# Patient Record
Sex: Female | Born: 1970 | Race: White | Hispanic: No | Marital: Single | Smoking: Current every day smoker
Health system: Southern US, Community
[De-identification: ages and names within clinical notes are randomized; demographics above are authoritative.]

## PROBLEM LIST (undated history)

## (undated) DIAGNOSIS — F32A Depression, unspecified: Secondary | ICD-10-CM

## (undated) DIAGNOSIS — F329 Major depressive disorder, single episode, unspecified: Secondary | ICD-10-CM

---

## 2012-09-09 ENCOUNTER — Other Ambulatory Visit: Payer: Self-pay | Admitting: Obstetrics & Gynecology

## 2012-09-09 DIAGNOSIS — R928 Other abnormal and inconclusive findings on diagnostic imaging of breast: Secondary | ICD-10-CM

## 2012-09-21 ENCOUNTER — Other Ambulatory Visit: Payer: Self-pay

## 2012-09-24 ENCOUNTER — Other Ambulatory Visit: Payer: Self-pay

## 2012-09-29 ENCOUNTER — Ambulatory Visit
Admission: RE | Admit: 2012-09-29 | Discharge: 2012-09-29 | Disposition: A | Discharge status: Home / Self Care | Payer: No Typology Code available for payment source | Source: Ambulatory Visit | Attending: Obstetrics & Gynecology | Admitting: Obstetrics & Gynecology

## 2012-09-29 ENCOUNTER — Other Ambulatory Visit: Payer: Self-pay | Admitting: Obstetrics & Gynecology

## 2012-09-29 DIAGNOSIS — R928 Other abnormal and inconclusive findings on diagnostic imaging of breast: Secondary | ICD-10-CM

## 2012-09-29 DIAGNOSIS — R921 Mammographic calcification found on diagnostic imaging of breast: Secondary | ICD-10-CM

## 2012-10-07 ENCOUNTER — Ambulatory Visit
Admission: RE | Admit: 2012-10-07 | Discharge: 2012-10-07 | Disposition: A | Discharge status: Home / Self Care | Payer: No Typology Code available for payment source | Source: Ambulatory Visit | Attending: Obstetrics & Gynecology | Admitting: Obstetrics & Gynecology

## 2012-10-07 ENCOUNTER — Other Ambulatory Visit: Payer: Self-pay | Admitting: Obstetrics & Gynecology

## 2012-10-07 DIAGNOSIS — R928 Other abnormal and inconclusive findings on diagnostic imaging of breast: Secondary | ICD-10-CM

## 2012-10-07 DIAGNOSIS — R921 Mammographic calcification found on diagnostic imaging of breast: Secondary | ICD-10-CM

## 2012-10-08 ENCOUNTER — Ambulatory Visit
Admission: RE | Admit: 2012-10-08 | Discharge: 2012-10-08 | Disposition: A | Discharge status: Home / Self Care | Payer: No Typology Code available for payment source | Source: Ambulatory Visit | Attending: Obstetrics & Gynecology | Admitting: Obstetrics & Gynecology

## 2012-10-08 ENCOUNTER — Other Ambulatory Visit: Payer: Self-pay | Admitting: Obstetrics & Gynecology

## 2012-10-08 DIAGNOSIS — R921 Mammographic calcification found on diagnostic imaging of breast: Secondary | ICD-10-CM

## 2013-05-11 ENCOUNTER — Other Ambulatory Visit: Payer: Self-pay | Admitting: Obstetrics & Gynecology

## 2013-05-11 DIAGNOSIS — R921 Mammographic calcification found on diagnostic imaging of breast: Secondary | ICD-10-CM

## 2013-05-27 ENCOUNTER — Ambulatory Visit
Admission: RE | Admit: 2013-05-27 | Discharge: 2013-05-27 | Disposition: A | Discharge status: Home / Self Care | Payer: No Typology Code available for payment source | Source: Ambulatory Visit | Attending: Obstetrics & Gynecology | Admitting: Obstetrics & Gynecology

## 2013-05-27 DIAGNOSIS — R921 Mammographic calcification found on diagnostic imaging of breast: Secondary | ICD-10-CM

## 2013-12-24 ENCOUNTER — Emergency Department (HOSPITAL_COMMUNITY)
Admission: EM | Admit: 2013-12-24 | Discharge: 2013-12-24 | Disposition: A | Discharge status: Home / Self Care | Payer: No Typology Code available for payment source | Attending: Emergency Medicine | Admitting: Emergency Medicine

## 2013-12-24 ENCOUNTER — Encounter (HOSPITAL_COMMUNITY): Payer: Self-pay | Admitting: Emergency Medicine

## 2013-12-24 DIAGNOSIS — Z792 Long term (current) use of antibiotics: Secondary | ICD-10-CM | POA: Insufficient documentation

## 2013-12-24 DIAGNOSIS — M7989 Other specified soft tissue disorders: Secondary | ICD-10-CM

## 2013-12-24 DIAGNOSIS — R0602 Shortness of breath: Secondary | ICD-10-CM

## 2013-12-24 DIAGNOSIS — H6690 Otitis media, unspecified, unspecified ear: Secondary | ICD-10-CM | POA: Insufficient documentation

## 2013-12-24 DIAGNOSIS — R0609 Other forms of dyspnea: Secondary | ICD-10-CM | POA: Insufficient documentation

## 2013-12-24 DIAGNOSIS — F17200 Nicotine dependence, unspecified, uncomplicated: Secondary | ICD-10-CM | POA: Insufficient documentation

## 2013-12-24 DIAGNOSIS — Z79899 Other long term (current) drug therapy: Secondary | ICD-10-CM | POA: Insufficient documentation

## 2013-12-24 DIAGNOSIS — H6691 Otitis media, unspecified, right ear: Secondary | ICD-10-CM

## 2013-12-24 DIAGNOSIS — F329 Major depressive disorder, single episode, unspecified: Secondary | ICD-10-CM | POA: Insufficient documentation

## 2013-12-24 DIAGNOSIS — R0989 Other specified symptoms and signs involving the circulatory and respiratory systems: Secondary | ICD-10-CM | POA: Insufficient documentation

## 2013-12-24 DIAGNOSIS — R609 Edema, unspecified: Secondary | ICD-10-CM | POA: Insufficient documentation

## 2013-12-24 DIAGNOSIS — F3289 Other specified depressive episodes: Secondary | ICD-10-CM | POA: Insufficient documentation

## 2013-12-24 HISTORY — DX: Depression, unspecified: F32.A

## 2013-12-24 HISTORY — DX: Major depressive disorder, single episode, unspecified: F32.9

## 2013-12-24 MED ORDER — AMOXICILLIN 500 MG PO CAPS: 500.0000 mg | ORAL_CAPSULE | Freq: Three times a day (TID) | ORAL | Status: AC

## 2013-12-24 NOTE — ED Notes (Signed)
Pt reports recently traveling in a car long distance, having swelling to bilateral lower legs. Pt went to an ucc today for ear pain and diagnosed with ear infection but sent here to r/o dvt. Airway intact, no acute distress noted at triage.

## 2013-12-24 NOTE — ED Notes (Signed)
Asked to come to [facility] Ed b/c of SOB and bilateral pitting edema.  Been traveling much in the last week or so.

## 2013-12-24 NOTE — Progress Notes (Signed)
VASCULAR LAB PRELIMINARY  PRELIMINARY  PRELIMINARY  PRELIMINARY  Bilateral lower extremity venous Dopplers completed.    Preliminary report:  There is no DVT or SVT noted in the bilateral lower extremities.  Masami Plata, RVT 12/24/2013, 5:45 PM

## 2013-12-25 NOTE — ED Provider Notes (Signed)
CSN: [id number]     Arrival date & time 12/24/13  1554 History   First MD Initiated Contact with Patient 12/24/13 2249     Chief Complaint  Patient presents with  . Leg Swelling    HPI Pt presents for multiple complaints:  She has noticed bilateral LE swelling for past several days, she reports it is worsening and nothing improves her symptoms.  She also reports recent travel and is concerned for DVT  She also reports recent cough and flu like symptoms and now has bilateral ear pain. She reports her cough is improving She was seen at an urgent care for her symptoms and sent for evaluation  She also reports difficulty taking a deep breath - denies chest pain, but does report some SOB at times, but none currently   Past Medical History  Diagnosis Date  . Depression    History reviewed. No pertinent past surgical history. History reviewed. No pertinent family history. History  Substance Use Topics  . Smoking status: Current Every Day Smoker -- 0.50 packs/day    Types: Cigarettes  . Smokeless tobacco: Not on file  . Alcohol Use: Yes     Comment: moderate   OB History   Grav Para Term Preterm Abortions TAB SAB Ect Mult Living                 Review of Systems  Respiratory: Positive for cough and shortness of breath.   Cardiovascular: Positive for leg swelling. Negative for chest pain.  All other systems reviewed and are negative.    Allergies  Other and Tamiflu  Home Medications   Current Outpatient Rx  Name  Route  Sig  Dispense  Refill  . acetaminophen (TYLENOL) 500 MG tablet   Oral   Take 1,000 mg by mouth 3 (three) times daily as needed for moderate pain.         Marland Kitchen ALPRAZolam (XANAX) 0.25 MG tablet   Oral   Take 0.25 mg by mouth daily as needed for anxiety.          . carboxymethylcellulose (REFRESH PLUS) 0.5 % SOLN   Both Eyes   Place 1 drop into both eyes 4 (four) times daily as needed (for dry eyes).          . DULoxetine (CYMBALTA) 60 MG  capsule   Oral   Take 60 mg by mouth daily.         Marland Kitchen ibuprofen (ADVIL,MOTRIN) 200 MG tablet   Oral   Take 400 mg by mouth every 6 (six) hours as needed for moderate pain.         Marland Kitchen lamoTRIgine (LAMICTAL) 25 MG tablet   Oral   Take 50 mg by mouth every morning.         . Multiple Vitamins-Minerals (CENTRUM) tablet   Oral   Take 1 tablet by mouth daily.         . norethindrone (MICRONOR,CAMILA,ERRIN) 0.35 MG tablet   Oral   Take 1 tablet by mouth daily.         . Pseudoeph-Doxylamine-DM-APAP (NYQUIL MULTI-SYMPTOM PO)   Oral   Take 10 mLs by mouth every 6 (six) hours as needed (for cold).         Marland Kitchen amoxicillin (AMOXIL) 500 MG capsule   Oral   Take 1 capsule (500 mg total) by mouth 3 (three) times daily.   21 capsule   0    BP 115/80  Pulse 93  Temp(Src) 98.6  F (37 C) (Oral)  Resp 18  SpO2 100% Physical Exam CONSTITUTIONAL: Well developed/well nourished HEAD: Normocephalic/atraumatic EYES: EOMI/PERRL ENMT: Mucous membranes moist, right TM erythematous, bulging, TM is intact NECK: supple no meningeal signs CV: S1/S2 noted, no murmurs/rubs/gallops noted LUNGS: Lungs are clear to auscultation bilaterally, no apparent distress ABDOMEN: soft, nontender, no rebound or guarding NEURO: Pt is awake/alert, moves all extremitiesx4 EXTREMITIES: pulses normal, full ROM, minimal pitting edema to bilateral LE, no erythema, minimal tenderness noted SKIN: warm, color normal PSYCH: anxious  ED Course  Procedures (including critical care time) Labs Review Labs Reviewed - No data to display Imaging Review No results found.  EKG Interpretation   None       MDM   1. Otitis media, right   2. Peripheral edema    Nursing notes including past medical history and social history reviewed and considered in documentation   Pt with negative DVT studies here She is in no distress, no hypoxia I doubt PE.  I did offer further testing including CXR/EKG but she feels  improved and would like to go home She reports she will f/u with PCP in two days Will place on amox for otitis media      Joya Gaskins, MD 12/25/13 [phone]

## 2014-10-04 ENCOUNTER — Other Ambulatory Visit: Payer: Self-pay

## 2014-10-05 LAB — CYTOLOGY - PAP

## 2014-10-11 ENCOUNTER — Other Ambulatory Visit: Payer: Self-pay | Admitting: Obstetrics & Gynecology

## 2014-10-11 DIAGNOSIS — R921 Mammographic calcification found on diagnostic imaging of breast: Secondary | ICD-10-CM

## 2014-10-20 ENCOUNTER — Ambulatory Visit
Admission: RE | Admit: 2014-10-20 | Discharge: 2014-10-20 | Disposition: A | Discharge status: Home / Self Care | Payer: 59 | Source: Ambulatory Visit | Attending: Obstetrics & Gynecology | Admitting: Obstetrics & Gynecology

## 2014-10-20 ENCOUNTER — Encounter (INDEPENDENT_AMBULATORY_CARE_PROVIDER_SITE_OTHER): Payer: Self-pay

## 2014-10-20 DIAGNOSIS — R921 Mammographic calcification found on diagnostic imaging of breast: Secondary | ICD-10-CM

## 2015-01-09 IMAGING — MG MM DIAGNOSTIC BILATERAL
8 of 9 series · 8 of 9 positions shown · non-contrast
Comparison: Previous exams.

CLINICAL DATA: Patient presents for a bilateral diagnostic exam to
followup left breast microcalcifications.

EXAM:
DIGITAL DIAGNOSTIC  bilateral MAMMOGRAM WITH CAD

[R CC]
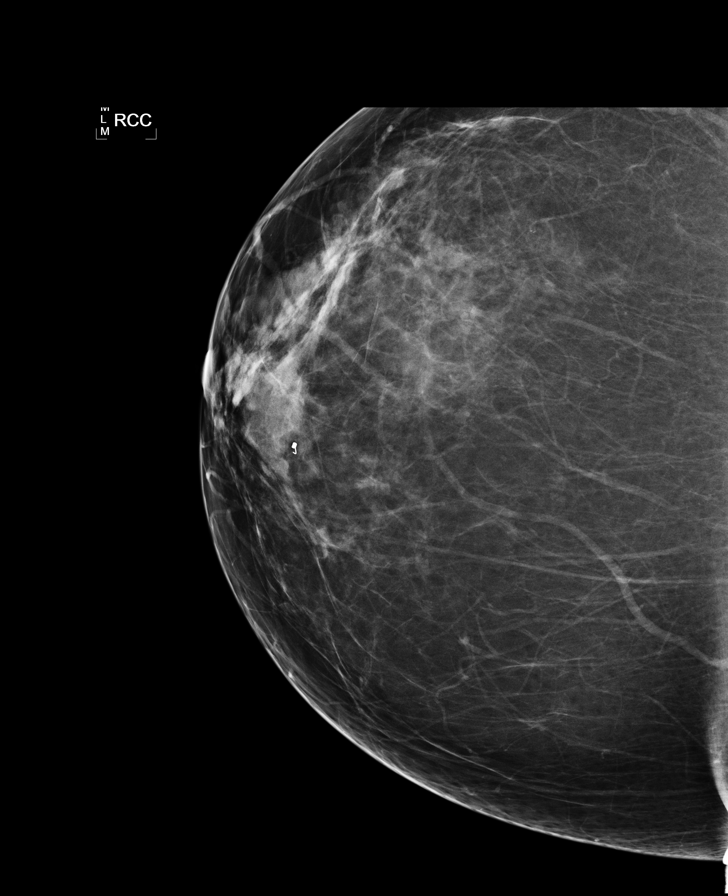

[L CC (1 of 2)]
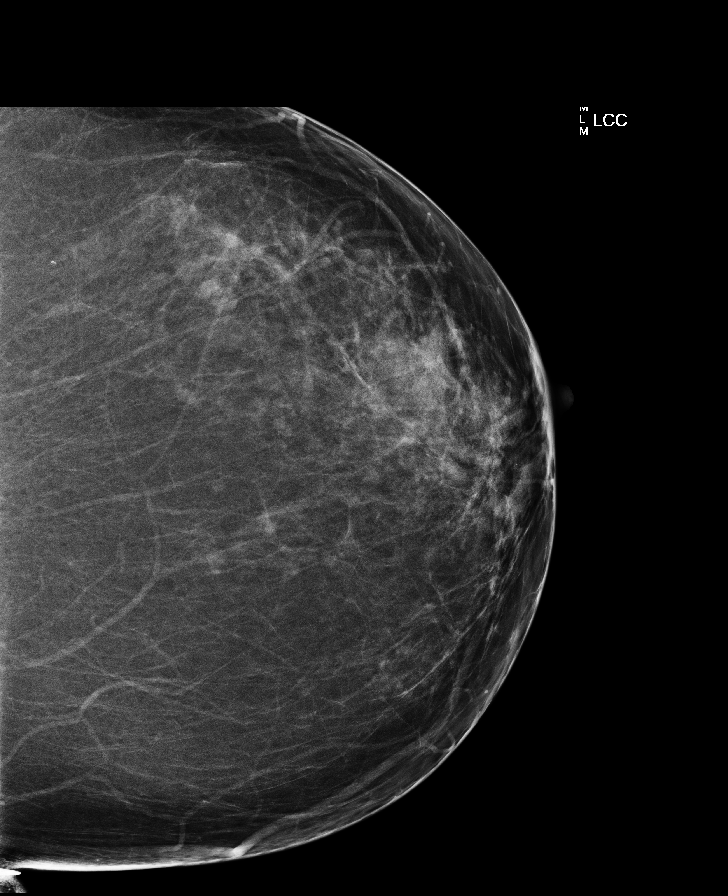

[L MLO]
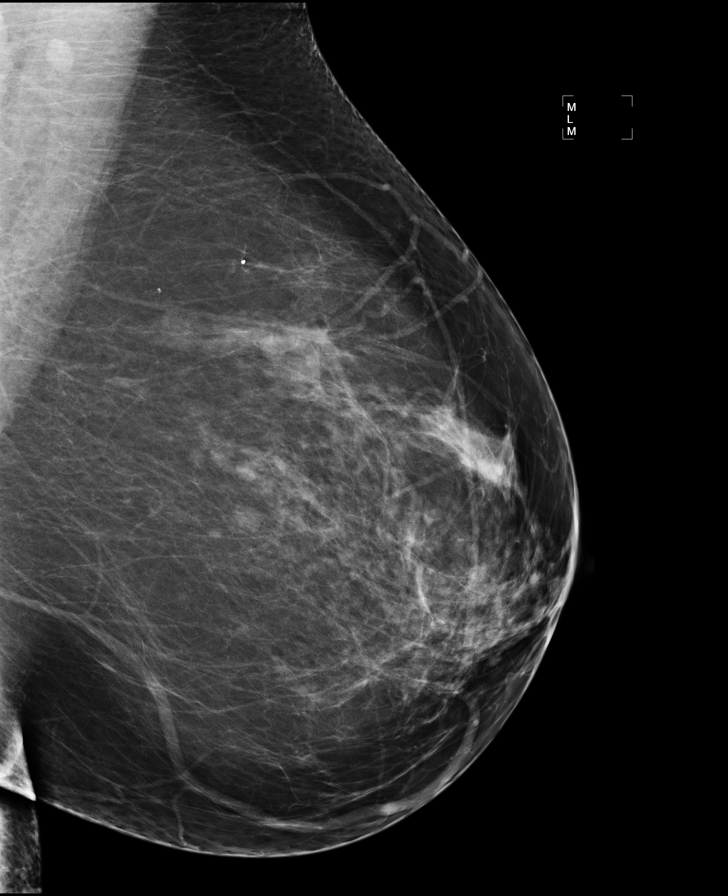

[R MLO]
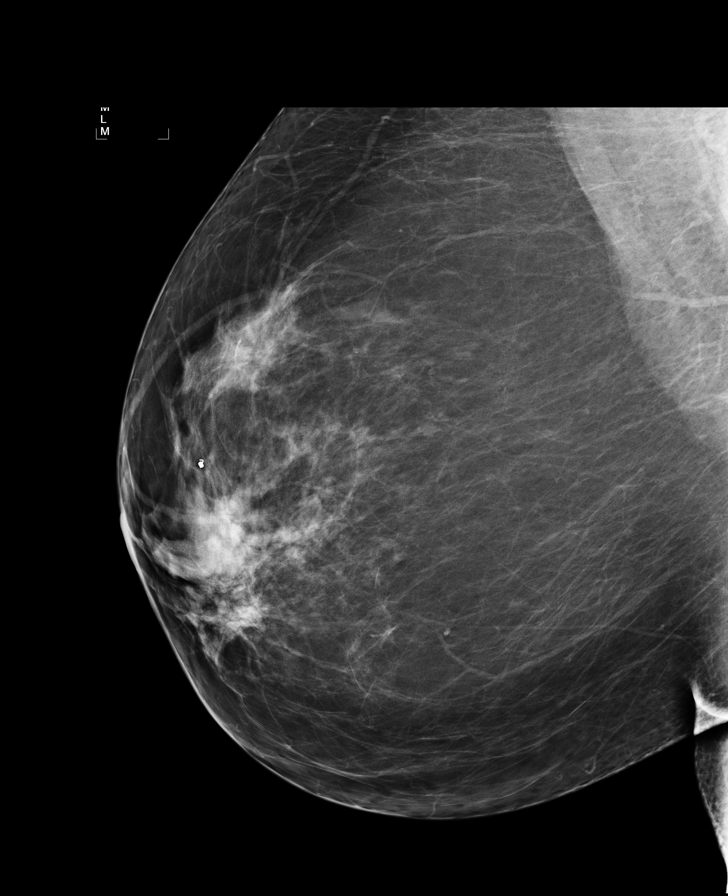

[L CC (2 of 2)]
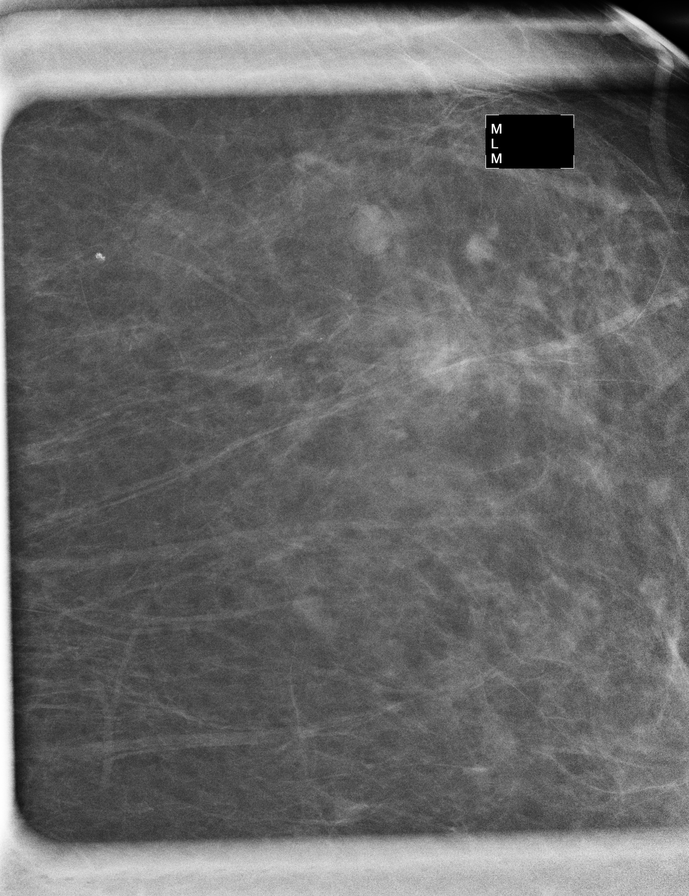

[L ML (1 of 3)]
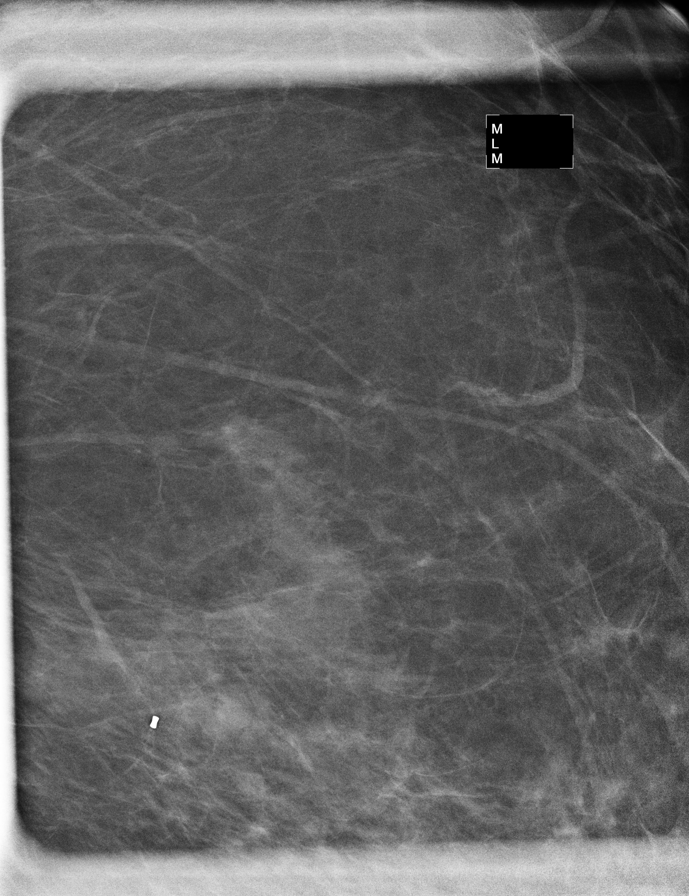

[L ML (2 of 3)]
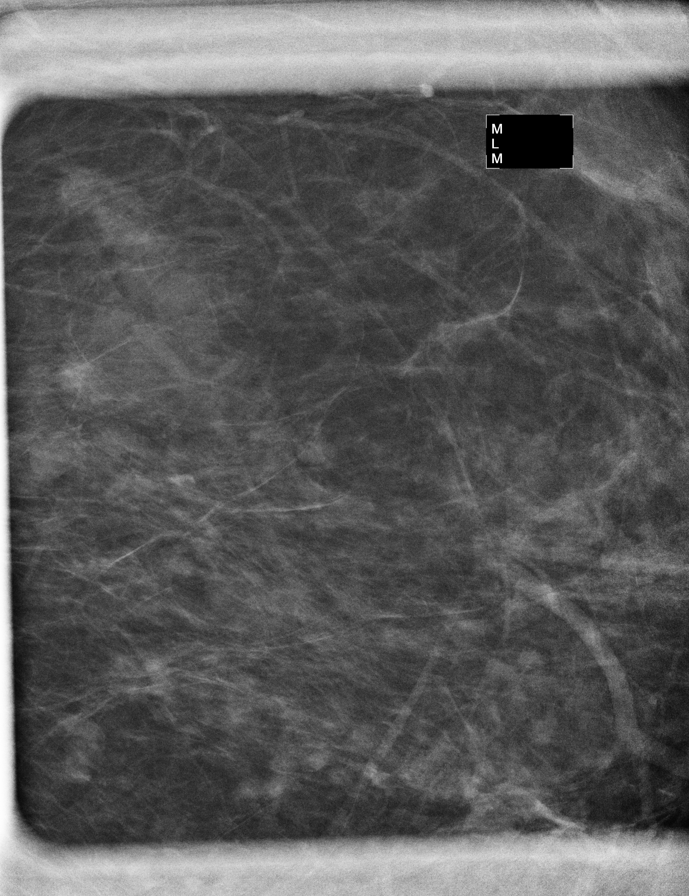

[L ML (3 of 3)]
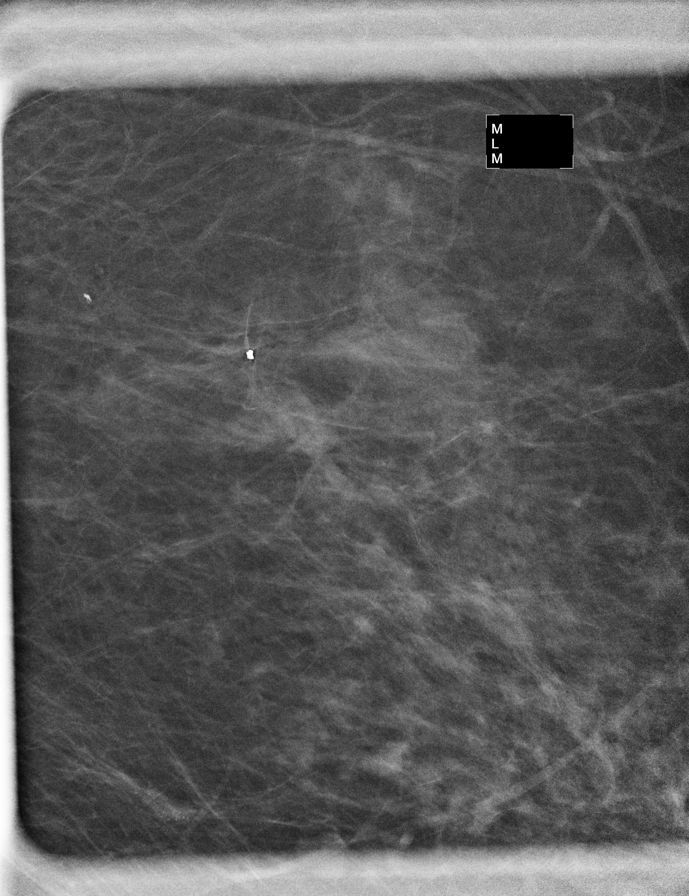

[8 of 9 positions shown; findings below may reference images not displayed]

ACR Breast Density Category b: There are scattered areas of
fibroglandular density.
FINDINGS: Examination again demonstrates a group of fine/punctate
microcalcifications over the deep third of the outer lower left
breast spanning 5 x 8 x 11 mm. These are unchanged on the Mag CC
image with possible slight increase in number on the Mag MLO image,
although this is may be due in part to technique with minimal motion
on the previous images. Remainder of the exam is unchanged.

Mammographic images were processed with CAD.
IMPRESSION: Typically benign group of microcalcifications over the deep third of
the outer lower left breast spanning 5 x 8 x 11 mm without
significant change.

RECOMMENDATION:
As a precaution, would recommend 1 additional followup diagnostic
left breast mammogram with magnification views in 1 year at the time
of patient's annual mammogram to confirm stability.

I have discussed the findings and recommendations with the patient.
Results were also provided in writing at the conclusion of the
visit. If applicable, a reminder letter will be sent to the patient
regarding the next appointment.

BI-RADS CATEGORY  3: Probably benign.

## 2016-12-03 ENCOUNTER — Other Ambulatory Visit: Payer: Self-pay | Admitting: Obstetrics & Gynecology

## 2016-12-03 DIAGNOSIS — R921 Mammographic calcification found on diagnostic imaging of breast: Secondary | ICD-10-CM

## 2016-12-09 ENCOUNTER — Ambulatory Visit
Admission: RE | Admit: 2016-12-09 | Discharge: 2016-12-09 | Disposition: A | Discharge status: Home / Self Care | Payer: BLUE CROSS/BLUE SHIELD | Source: Ambulatory Visit | Attending: Obstetrics & Gynecology | Admitting: Obstetrics & Gynecology

## 2016-12-09 ENCOUNTER — Other Ambulatory Visit: Payer: Self-pay | Admitting: Obstetrics & Gynecology

## 2016-12-09 DIAGNOSIS — R921 Mammographic calcification found on diagnostic imaging of breast: Secondary | ICD-10-CM

## 2016-12-09 DIAGNOSIS — N6320 Unspecified lump in the left breast, unspecified quadrant: Secondary | ICD-10-CM

## 2017-02-28 IMAGING — US ULTRASOUND LEFT BREAST LIMITED
1 series · 7 of 7 positions shown · non-contrast
Comparison: Previous exam(s).

CLINICAL DATA: Followup for probably benign left breast
calcifications.

EXAM:
2D DIGITAL DIAGNOSTIC BILATERAL MAMMOGRAM WITH CAD AND ADJUNCT TOMO
LEFT BREAST ULTRASOUND

[Series 1: ultrasound left breast limited · 0.06mm/px · 7 of 7 slices shown]
[im 1/7]
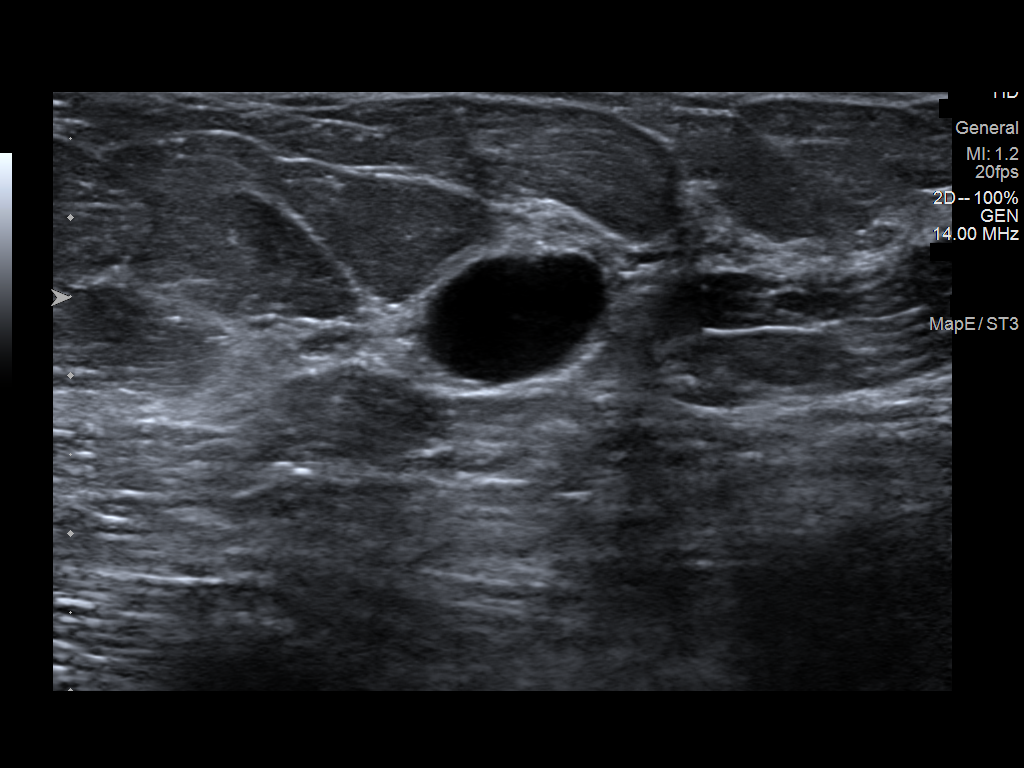
[im 2/7]
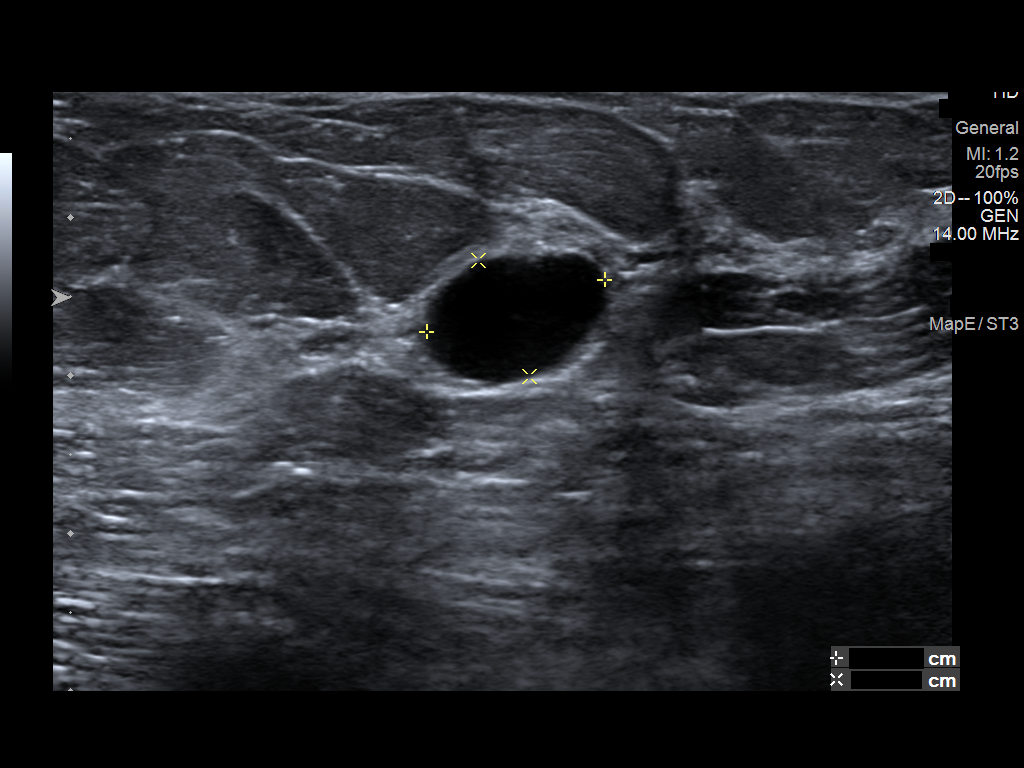
[im 3/7]
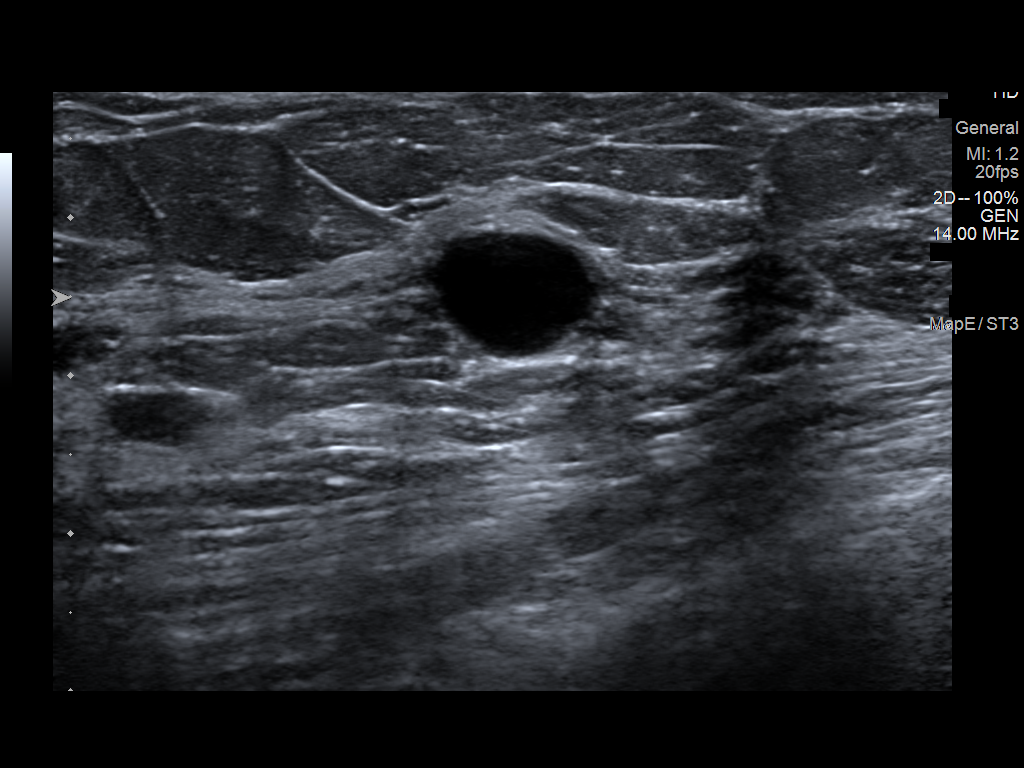
[im 4/7]
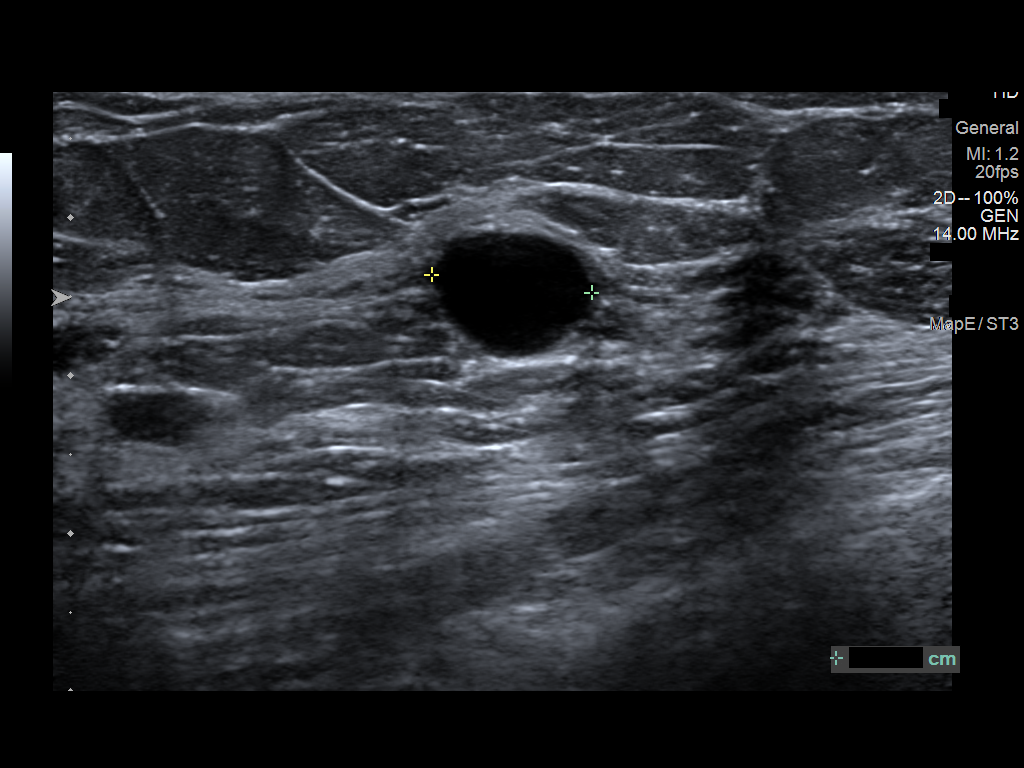
[im 5/7]
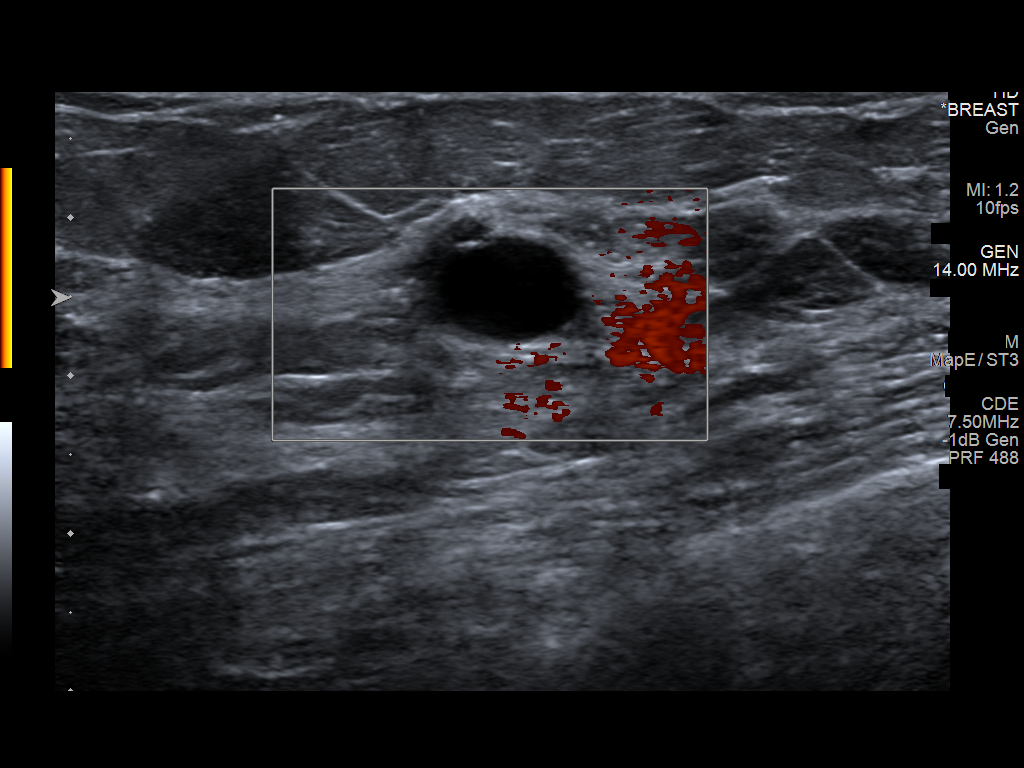
[im 6/7]
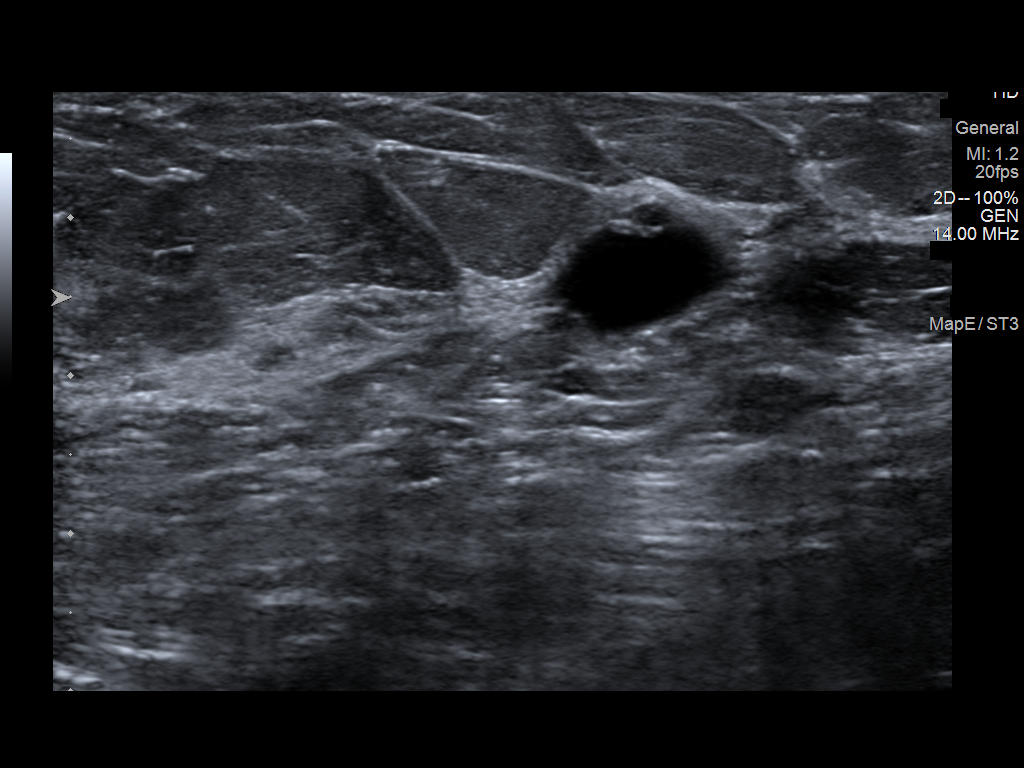
[im 7/7]
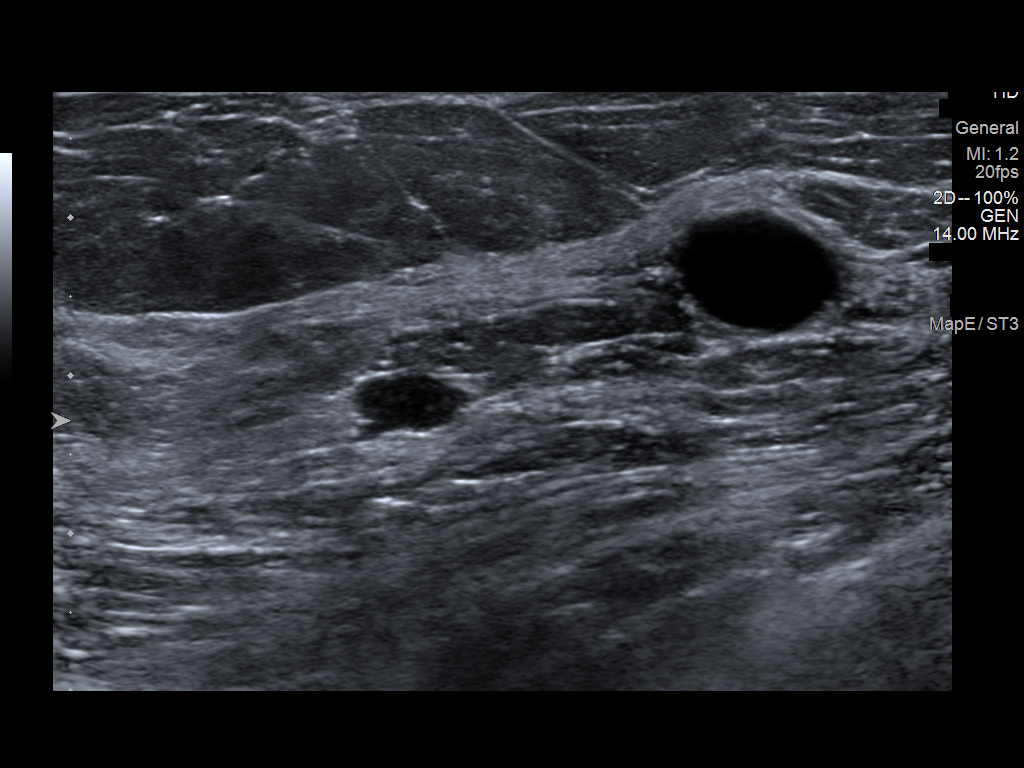

[7 of 7 positions shown; findings below may reference images not displayed]

ACR Breast Density Category b: There are scattered areas of
fibroglandular density.
FINDINGS: No suspicious masses or calcifications are seen in the right breast.
There is an oval mass in the outer left breast measuring
approximately 1.3 cm. Spot compression magnification views were
performed over the slightly outer left breast demonstrating a stable
7 mm group of punctate calcifications.

Mammographic images were processed with CAD.

Targeted ultrasound of the outer left breast was performed. There is
a cyst at 2 o'clock 6 cm from nipple measuring 1.2 x 0.8 x 1 cm.
This corresponds well with the mass seen at mammography. In addition
smaller cysts are seen in the outer left breast.
IMPRESSION: 1. Benign left breast calcifications which have demonstrated greater
than 2 years of stability.

2. Benign left breast cyst.

3.  No mammographic evidence of malignancy in either breast.

RECOMMENDATION:
Screening mammogram in one year.(Code:[id number]-Q-WA1)

I have discussed the findings and recommendations with the patient.
Results were also provided in writing at the conclusion of the
visit. If applicable, a reminder letter will be sent to the patient
regarding the next appointment.

BI-RADS CATEGORY  2: Benign.

## 2023-01-29 DIAGNOSIS — Z419 Encounter for procedure for purposes other than remedying health state, unspecified: Secondary | ICD-10-CM | POA: Diagnosis not present

## 2023-02-27 DIAGNOSIS — Z419 Encounter for procedure for purposes other than remedying health state, unspecified: Secondary | ICD-10-CM | POA: Diagnosis not present

## 2023-03-30 DIAGNOSIS — Z419 Encounter for procedure for purposes other than remedying health state, unspecified: Secondary | ICD-10-CM | POA: Diagnosis not present
